# Patient Record
Sex: Male | Born: 1995 | Race: Black or African American | Hispanic: No | Marital: Single | Smoking: Former smoker
Health system: Southern US, Community
[De-identification: ages and names within clinical notes are randomized; demographics above are authoritative.]

---

## 2014-07-08 ENCOUNTER — Inpatient Hospital Stay (HOSPITAL_COMMUNITY)
Admission: EM | Admit: 2014-07-08 | Discharge: 2014-07-09 | DRG: 208 | Disposition: A | Discharge status: Home / Self Care | Payer: Self-pay | Attending: Pulmonary Disease | Admitting: Pulmonary Disease

## 2014-07-08 ENCOUNTER — Encounter (HOSPITAL_COMMUNITY): Payer: Self-pay | Admitting: Emergency Medicine

## 2014-07-08 ENCOUNTER — Emergency Department (HOSPITAL_COMMUNITY): Payer: Self-pay

## 2014-07-08 DIAGNOSIS — J9600 Acute respiratory failure, unspecified whether with hypoxia or hypercapnia: Principal | ICD-10-CM | POA: Diagnosis present

## 2014-07-08 DIAGNOSIS — F10120 Alcohol abuse with intoxication, uncomplicated: Secondary | ICD-10-CM

## 2014-07-08 DIAGNOSIS — R451 Restlessness and agitation: Secondary | ICD-10-CM | POA: Diagnosis present

## 2014-07-08 DIAGNOSIS — R4020 Unspecified coma: Secondary | ICD-10-CM

## 2014-07-08 DIAGNOSIS — R112 Nausea with vomiting, unspecified: Secondary | ICD-10-CM

## 2014-07-08 DIAGNOSIS — F1210 Cannabis abuse, uncomplicated: Secondary | ICD-10-CM | POA: Diagnosis present

## 2014-07-08 DIAGNOSIS — T68XXXA Hypothermia, initial encounter: Secondary | ICD-10-CM

## 2014-07-08 DIAGNOSIS — R4182 Altered mental status, unspecified: Secondary | ICD-10-CM

## 2014-07-08 DIAGNOSIS — G9340 Encephalopathy, unspecified: Secondary | ICD-10-CM | POA: Diagnosis present

## 2014-07-08 DIAGNOSIS — F10929 Alcohol use, unspecified with intoxication, unspecified: Secondary | ICD-10-CM

## 2014-07-08 DIAGNOSIS — F1010 Alcohol abuse, uncomplicated: Secondary | ICD-10-CM | POA: Diagnosis present

## 2014-07-08 LAB — URINALYSIS, ROUTINE W REFLEX MICROSCOPIC
Bilirubin Urine: NEGATIVE
Glucose, UA: NEGATIVE mg/dL
KETONES UR: NEGATIVE mg/dL
Leukocytes, UA: NEGATIVE
Nitrite: NEGATIVE
PROTEIN: NEGATIVE mg/dL
SPECIFIC GRAVITY, URINE: 1.009 (ref 1.005–1.030)
Urobilinogen, UA: 0.2 mg/dL (ref 0.0–1.0)
pH: 6 (ref 5.0–8.0)

## 2014-07-08 LAB — BLOOD GAS, ARTERIAL
Acid-base deficit: 1.8 mmol/L (ref 0.0–2.0)
Bicarbonate: 22.4 mEq/L (ref 20.0–24.0)
Drawn by: 31814
FIO2: 1 %
MECHVT: 550 mL
O2 SAT: 99.3 %
PEEP: 5 cmH2O
Patient temperature: 95.5
RATE: 16 resp/min
TCO2: 19.4 mmol/L (ref 0–100)
pCO2 arterial: 35.2 mmHg (ref 35.0–45.0)
pH, Arterial: 7.41 (ref 7.350–7.450)
pO2, Arterial: 530 mmHg — ABNORMAL HIGH (ref 80.0–100.0)

## 2014-07-08 LAB — COMPREHENSIVE METABOLIC PANEL
ALK PHOS: 78 U/L (ref 39–117)
ALT: 15 U/L (ref 0–53)
AST: 19 U/L (ref 0–37)
Albumin: 4.7 g/dL (ref 3.5–5.2)
Anion gap: 6 (ref 5–15)
BUN: 8 mg/dL (ref 6–23)
CALCIUM: 8.7 mg/dL (ref 8.4–10.5)
CO2: 25 mmol/L (ref 19–32)
Chloride: 105 mEq/L (ref 96–112)
Creatinine, Ser: 1.03 mg/dL (ref 0.50–1.35)
GFR calc Af Amer: >90 mL/min (ref >90)
Glucose, Bld: 130 mg/dL — ABNORMAL HIGH (ref 70–99)
Potassium: 3.8 mmol/L (ref 3.5–5.1)
Sodium: 136 mmol/L (ref 135–145)
Total Bilirubin: 0.5 mg/dL (ref 0.3–1.2)
Total Protein: 7.8 g/dL (ref 6.0–8.3)

## 2014-07-08 LAB — CBC WITH DIFFERENTIAL/PLATELET
BASOS PCT: 0 % (ref 0–1)
Basophils Absolute: 0 10*3/uL (ref 0.0–0.1)
EOS ABS: 0 10*3/uL (ref 0.0–0.7)
EOS PCT: 1 % (ref 0–5)
HCT: 46.3 % (ref 39.0–52.0)
Hemoglobin: 14.9 g/dL (ref 13.0–17.0)
Lymphocytes Relative: 20 % (ref 12–46)
Lymphs Abs: 1.1 10*3/uL (ref 0.7–4.0)
MCH: 27.7 pg (ref 26.0–34.0)
MCHC: 32.2 g/dL (ref 30.0–36.0)
MCV: 86.1 fL (ref 78.0–100.0)
MONOS PCT: 5 % (ref 3–12)
Monocytes Absolute: 0.3 10*3/uL (ref 0.1–1.0)
Neutro Abs: 4.4 10*3/uL (ref 1.7–7.7)
Neutrophils Relative %: 74 % (ref 43–77)
Platelets: 214 10*3/uL (ref 150–400)
RBC: 5.38 MIL/uL (ref 4.22–5.81)
RDW: 13.8 % (ref 11.5–15.5)
WBC: 5.8 10*3/uL (ref 4.0–10.5)

## 2014-07-08 LAB — RAPID URINE DRUG SCREEN, HOSP PERFORMED
AMPHETAMINES: NOT DETECTED
Barbiturates: NOT DETECTED
Benzodiazepines: NOT DETECTED
COCAINE: NOT DETECTED
OPIATES: NOT DETECTED
Tetrahydrocannabinol: POSITIVE — AB

## 2014-07-08 LAB — URINE MICROSCOPIC-ADD ON

## 2014-07-08 LAB — MRSA PCR SCREENING: MRSA by PCR: NEGATIVE

## 2014-07-08 LAB — SALICYLATE LEVEL

## 2014-07-08 LAB — ETHANOL: ALCOHOL ETHYL (B): 246 mg/dL — AB (ref 0–9)

## 2014-07-08 LAB — CK: CK TOTAL: 280 U/L — AB (ref 7–232)

## 2014-07-08 LAB — ACETAMINOPHEN LEVEL

## 2014-07-08 MED ORDER — SODIUM CHLORIDE 0.9 % IV SOLN
250.0000 mL | INTRAVENOUS | Status: DC | PRN
  Administered 2014-07-09: 100 mL via INTRAVENOUS

## 2014-07-08 MED ORDER — ETOMIDATE 2 MG/ML IV SOLN
INTRAVENOUS | Status: AC
  Administered 2014-07-08: 20 mg
  Filled 2014-07-08: qty 20

## 2014-07-08 MED ORDER — LORAZEPAM 2 MG/ML IJ SOLN
INTRAMUSCULAR | Status: AC
Start: 2014-07-08 — End: 2014-07-08
  Filled 2014-07-08: qty 1

## 2014-07-08 MED ORDER — ONDANSETRON HCL 4 MG/2ML IJ SOLN: 4.0000 mg | Freq: Four times a day (QID) | INTRAMUSCULAR | Status: DC | PRN

## 2014-07-08 MED ORDER — ROCURONIUM BROMIDE 50 MG/5ML IV SOLN
INTRAVENOUS | Status: AC
  Filled 2014-07-08: qty 2

## 2014-07-08 MED ORDER — PROPOFOL 10 MG/ML IV EMUL
5.0000 ug/kg/min | Freq: Once | INTRAVENOUS | Status: AC
  Administered 2014-07-08: 44.322 ug/kg/min via INTRAVENOUS
  Filled 2014-07-08: qty 100

## 2014-07-08 MED ORDER — SODIUM CHLORIDE 0.9 % IV SOLN
250.0000 mL | INTRAVENOUS | Status: DC | PRN
Start: 2014-07-08 — End: 2014-07-08

## 2014-07-08 MED ORDER — FENTANYL CITRATE 0.05 MG/ML IJ SOLN: 100.0000 ug | INTRAMUSCULAR | Status: DC | PRN

## 2014-07-08 MED ORDER — PROPOFOL 10 MG/ML IV EMUL
INTRAVENOUS | Status: AC
  Filled 2014-07-08: qty 100

## 2014-07-08 MED ORDER — MENTHOL 3 MG MT LOZG
1.0000 | LOZENGE | OROMUCOSAL | Status: DC | PRN
  Administered 2014-07-08: 3 mg via ORAL
  Filled 2014-07-08: qty 9

## 2014-07-08 MED ORDER — MORPHINE SULFATE 2 MG/ML IJ SOLN
2.0000 mg | INTRAMUSCULAR | Status: DC | PRN
  Administered 2014-07-08: 2 mg via INTRAVENOUS

## 2014-07-08 MED ORDER — ONDANSETRON HCL 4 MG/2ML IJ SOLN
4.0000 mg | Freq: Once | INTRAMUSCULAR | Status: AC
  Administered 2014-07-08: 4 mg via INTRAVENOUS
  Filled 2014-07-08: qty 2

## 2014-07-08 MED ORDER — SUCCINYLCHOLINE CHLORIDE 20 MG/ML IJ SOLN
INTRAMUSCULAR | Status: AC
Start: 2014-07-08 — End: 2014-07-08
  Administered 2014-07-08: 125 mg
  Filled 2014-07-08: qty 1

## 2014-07-08 MED ORDER — MORPHINE SULFATE 2 MG/ML IJ SOLN
INTRAMUSCULAR | Status: AC
  Filled 2014-07-08: qty 1

## 2014-07-08 MED ORDER — ACETAMINOPHEN 325 MG PO TABS: 650.0000 mg | ORAL_TABLET | ORAL | Status: DC | PRN

## 2014-07-08 MED ORDER — SODIUM CHLORIDE 0.9 % IV BOLUS (SEPSIS): 1000.0000 mL | Freq: Once | INTRAVENOUS | Status: DC

## 2014-07-08 MED ORDER — HEPARIN SODIUM (PORCINE) 5000 UNIT/ML IJ SOLN
5000.0000 [IU] | Freq: Three times a day (TID) | INTRAMUSCULAR | Status: DC
  Administered 2014-07-08 – 2014-07-09 (×3): 5000 [IU] via SUBCUTANEOUS
  Filled 2014-07-08 (×3): qty 1

## 2014-07-08 MED ORDER — FAMOTIDINE IN NACL 20-0.9 MG/50ML-% IV SOLN
20.0000 mg | Freq: Two times a day (BID) | INTRAVENOUS | Status: DC
  Administered 2014-07-08 (×2): 20 mg via INTRAVENOUS
  Filled 2014-07-08 (×2): qty 50

## 2014-07-08 MED ORDER — LIDOCAINE HCL (CARDIAC) 20 MG/ML IV SOLN
INTRAVENOUS | Status: AC
  Administered 2014-07-08: 04:00:00
  Filled 2014-07-08: qty 5

## 2014-07-08 MED FILL — Medication: Qty: 1 | Status: AC

## 2014-07-08 NOTE — ED Provider Notes (Signed)
4:04 AM  Patient pulled out IV after being intubated and started to become agitated. IO access established by me. Patient given propfol and is now sedated.    CRITICAL CARE Performed by: Emilia BeckKaitlyn Kavon Valenza   Total critical care time: 30 minutes  Critical care time was exclusive of separately billable procedures and treating other patients.  Critical care was necessary to treat or prevent imminent or life-threatening deterioration.  Critical care was time spent personally by me on the following activities: development of treatment plan with patient and/or surrogate as well as nursing, discussions with consultants, evaluation of patient's response to treatment, examination of patient, obtaining history from patient or surrogate, ordering and performing treatments and interventions, ordering and review of laboratory studies, ordering and review of radiographic studies, pulse oximetry and re-evaluation of patient's condition.   Emilia BeckKaitlyn Zoey Bidwell, PA-C 07/08/14 0408  Ward GivensIva L Knapp, MD 07/08/14 [phone]
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
## 2014-07-08 NOTE — ED Notes (Signed)
Level 1 fluid warmer and bear hugger being used for pt due to pt temp of 50F

## 2014-07-08 NOTE — Progress Notes (Signed)
Patient c/o chest pain, non radiating.  EKG done.  Dr. Delton CoombesByrum paged to notify him of chest pain.  Cepacol given for c/o sore throat.  Continue to monitor closely.  Darold Miley Debroah LoopArnold RN
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
## 2014-07-08 NOTE — ED Notes (Signed)
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

## 2014-07-08 NOTE — ED Notes (Signed)
Soft wrist restraints applied to pt bilaterally due to pt becoming agitated while under sedation began pulling out ET tube.
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
## 2014-07-08 NOTE — Progress Notes (Signed)
Extubated to room air. Pt awake and alert vital signs stable.
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
## 2014-07-09 ENCOUNTER — Encounter: Payer: Self-pay | Admitting: Pulmonary Disease

## 2014-07-09 DIAGNOSIS — R4182 Altered mental status, unspecified: Secondary | ICD-10-CM

## 2014-07-09 DIAGNOSIS — F10129 Alcohol abuse with intoxication, unspecified: Secondary | ICD-10-CM
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
## 2014-07-09 NOTE — Progress Notes (Signed)
Initial review completed.  Patient has been discharged and Dc summary has been included.  Currently listed as self pay yet may have insurance
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
## 2016-02-02 ENCOUNTER — Emergency Department (HOSPITAL_COMMUNITY): Payer: Self-pay

## 2016-02-02 ENCOUNTER — Emergency Department (HOSPITAL_COMMUNITY)
Admission: EM | Admit: 2016-02-02 | Discharge: 2016-02-02 | Disposition: A | Discharge status: Home / Self Care | Payer: Self-pay | Attending: Physician Assistant | Admitting: Physician Assistant

## 2016-02-02 DIAGNOSIS — R4020 Unspecified coma: Secondary | ICD-10-CM | POA: Insufficient documentation

## 2016-02-02 DIAGNOSIS — F10220 Alcohol dependence with intoxication, uncomplicated: Secondary | ICD-10-CM | POA: Insufficient documentation

## 2016-02-02 DIAGNOSIS — F10920 Alcohol use, unspecified with intoxication, uncomplicated: Secondary | ICD-10-CM
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
## 2016-02-02 NOTE — ED Notes (Signed)
Bed: WA17 Expected date:  Expected time:  Means of arrival:  Comments: unresponsive
